# Patient Record
Sex: Female | Born: 1955 | Race: Black or African American | Hispanic: No | Marital: Married | Smoking: Never smoker
Health system: Southern US, Community
[De-identification: ages and names within clinical notes are randomized; demographics above are authoritative.]

## PROBLEM LIST (undated history)

## (undated) DIAGNOSIS — I10 Essential (primary) hypertension: Secondary | ICD-10-CM

## (undated) DIAGNOSIS — E7800 Pure hypercholesterolemia, unspecified: Secondary | ICD-10-CM

---

## 2011-09-09 ENCOUNTER — Emergency Department (INDEPENDENT_AMBULATORY_CARE_PROVIDER_SITE_OTHER): Payer: BC Managed Care – PPO

## 2011-09-09 ENCOUNTER — Encounter (HOSPITAL_COMMUNITY): Payer: Self-pay

## 2011-09-09 ENCOUNTER — Emergency Department (INDEPENDENT_AMBULATORY_CARE_PROVIDER_SITE_OTHER)
Admission: EM | Admit: 2011-09-09 | Discharge: 2011-09-09 | Disposition: A | Discharge status: Home / Self Care | Payer: BC Managed Care – PPO | Source: Home / Self Care | Attending: Family Medicine | Admitting: Family Medicine

## 2011-09-09 DIAGNOSIS — J209 Acute bronchitis, unspecified: Secondary | ICD-10-CM

## 2011-09-09 HISTORY — DX: Essential (primary) hypertension: I10

## 2011-09-09 HISTORY — DX: Pure hypercholesterolemia, unspecified: E78.00

## 2011-09-09 MED ORDER — AZITHROMYCIN 250 MG PO TABS: 250.0000 mg | ORAL_TABLET | Freq: Every day | ORAL | Status: AC

## 2011-09-09 MED ORDER — GUAIFENESIN-CODEINE 100-10 MG/5ML PO SYRP: ORAL_SOLUTION | ORAL | Status: DC

## 2011-09-09 NOTE — ED Notes (Signed)
Cold for a few days, getting worse productive since Monday ; decreased breath sounds RUL; barking , productive in nature w reported yellow secretions

## 2011-09-09 NOTE — ED Provider Notes (Signed)
History     CSN: [id number]  Arrival date & time 09/09/11  1312   First MD Initiated Contact with Patient 09/09/11 1517      Chief Complaint  Patient presents with  . Cough    (Consider location/radiation/quality/duration/timing/severity/associated sxs/prior treatment) HPI Comments: The patient reports having a cough for the past 3 days. Started in her head but that seems to be clearing. Cough is dry and on occasion productive. Worse at night. Keeping her awake. Taking otc cough prep. No fever. States she does not feel bad at all.   The history is provided by the patient.    Past Medical History  Diagnosis Date  . Diabetes mellitus   . Hypertension   . Elevated cholesterol     History reviewed. No pertinent past surgical history.  History reviewed. No pertinent family history.  History  Substance Use Topics  . Smoking status: Never Smoker   . Smokeless tobacco: Not on file  . Alcohol Use: No    OB History    Grav Para Term Preterm Abortions TAB SAB Ect Mult Living                  Review of Systems  Constitutional: Negative.   HENT: Positive for congestion and rhinorrhea. Negative for sore throat and trouble swallowing.   Eyes: Negative.   Respiratory: Positive for cough. Negative for shortness of breath and wheezing.   Cardiovascular: Negative.   Gastrointestinal: Negative.   Genitourinary: Negative.   Musculoskeletal: Negative.   Neurological: Negative for headaches.    Allergies  Review of patient's allergies indicates no known allergies.  Home Medications   Current Outpatient Rx  Name Route Sig Dispense Refill  . ASPIRIN 81 MG PO TABS Oral Take 81 mg by mouth daily.    Marland Kitchen VITAMIN D 1000 UNITS PO TABS Oral Take 1,000 Units by mouth daily.    Marland Kitchen LOSARTAN POTASSIUM-HCTZ 100-12.5 MG PO TABS Oral Take 1 tablet by mouth daily.    Marland Kitchen METFORMIN HCL 500 MG PO TABS Oral Take 500 mg by mouth 2 (two) times daily with a meal.    . AZITHROMYCIN 250 MG PO TABS  Oral Take 1 tablet (250 mg total) by mouth daily. Take first 2 tablets together, then 1 every day until finished. 6 tablet 0  . GUAIFENESIN-CODEINE 100-10 MG/5ML PO SYRP  1-2 tsp po q 6 hrs prn cough 120 mL 0    BP 140/89  Pulse 78  Temp(Src) 97.6 F (36.4 C) (Oral)  Resp 18  SpO2 96%  Physical Exam  Nursing note and vitals reviewed. Constitutional: She appears well-developed and well-nourished. No distress.  HENT:  Head: Normocephalic and atraumatic.  Mouth/Throat: Oropharynx is clear and moist.  Neck: Normal range of motion. Neck supple.  Cardiovascular: Normal rate, regular rhythm and normal heart sounds.   Pulmonary/Chest:       Constant cough. No wheezing.   Abdominal: Soft. Bowel sounds are normal. There is tenderness.  Lymphadenopathy:    She has no cervical adenopathy.  Skin: Skin is warm and dry.    ED Course  Procedures (including critical care time)  Labs Reviewed - No data to display Dg Chest 2 View  09/09/2011  *RADIOLOGY REPORT*  Clinical Data: Cough and chest congestion.  CHEST - 2 VIEW  Comparison:  None.  Findings:  The heart size and mediastinal contours are within normal limits.  Both lungs are clear.  The visualized skeletal structures are unremarkable.  IMPRESSION: No active  cardiopulmonary disease.  Original Report Authenticated By: Danae Orleans, M.D.   Xray appears neg for pneumonia. Radiology report not back yet  1. Bronchitis, acute       MDM         Randa Spike, MD 09/09/11 [phone]

## 2011-09-09 NOTE — Discharge Instructions (Signed)
Tylenol or motrin as needed. Avoid caffeine and milk products. Follow up with your pcp or return if symptoms persist or worsenAcute Bronchitis You have acute bronchitis. This means you have a chest cold. The airways in your lungs are red and sore (inflamed). Acute means it is sudden onset.  CAUSES Bronchitis is most often caused by the same virus that causes a cold. SYMPTOMS   Body aches.   Chest congestion.   Chills.   Cough.   Fever.   Shortness of breath.   Sore throat.  TREATMENT  Acute bronchitis is usually treated with rest, fluids, and medicines for relief of fever or cough. Most symptoms should go away after a few days or a week. Increased fluids may help thin your secretions and will prevent dehydration. Your caregiver may give you an inhaler to improve your symptoms. The inhaler reduces shortness of breath and helps control cough. You can take over-the-counter pain relievers or cough medicine to decrease coughing, pain, or fever. A cool-air vaporizer may help thin bronchial secretions and make it easier to clear your chest. Antibiotics are usually not needed but can be prescribed if you smoke, are seriously ill, have chronic lung problems, are elderly, or you are at higher risk for developing complications.Allergies and asthma can make bronchitis worse. Repeated episodes of bronchitis may cause longstanding lung problems. Avoid smoking and secondhand smoke.Exposure to cigarette smoke or irritating chemicals will make bronchitis worse. If you are a cigarette smoker, consider using nicotine gum or skin patches to help control withdrawal symptoms. Quitting smoking will help your lungs heal faster. Recovery from bronchitis is often slow, but you should start feeling better after 2 to 3 days. Cough from bronchitis frequently lasts for 3 to 4 weeks. To prevent another bout of acute bronchitis:  Quit smoking.   Wash your hands frequently to get rid of viruses or use a hand sanitizer.    Avoid other people with cold or virus symptoms.   Try not to touch your hands to your mouth, nose, or eyes.  SEEK IMMEDIATE MEDICAL CARE IF:  You develop increased fever, chills, or chest pain.   You have severe shortness of breath or bloody sputum.   You develop dehydration, fainting, repeated vomiting, or a severe headache.   You have no improvement after 1 week of treatment or you get worse.  MAKE SURE YOU:   Understand these instructions.   Will watch your condition.   Will get help right away if you are not doing well or get worse.  Document Released: 07/07/2004 Document Revised: 05/19/2011 Document Reviewed: 09/22/2010 [facility] Patient Information 2012 [place], [place].Bronchitis Bronchitis is a problem of the air tubes leading to your lungs. This problem makes it hard for air to get in and out of the lungs. You may cough a lot because your air tubes are narrow. Going without care can cause lasting (chronic) bronchitis. HOME CARE   Drink enough fluids to keep your pee (urine) clear or pale yellow.   Use a cool mist humidifier.   Quit smoking if you smoke. If you keep smoking, the bronchitis might not get better.   Only take medicine as told by your doctor.  GET HELP RIGHT AWAY IF:   Coughing keeps you awake.   You start to wheeze.   You become more sick or weak.   You have a hard time breathing or get short of breath.   You cough up blood.   Coughing lasts more than 2 weeks.  You have a fever.   Your baby is older than 3 months with a rectal temperature of 102 F (38.9 C) or higher.   Your baby is 73 months old or younger with a rectal temperature of 100.4 F (38 C) or higher.  MAKE SURE YOU:  Understand these instructions.   Will watch your condition.   Will get help right away if you are not doing well or get worse.  Document Released: 11/16/2007 Document Revised: 05/19/2011 Document Reviewed: 05/01/2009 [facility] Patient Information 2012  [place], [place].

## 2012-05-28 DIAGNOSIS — E785 Hyperlipidemia, unspecified: Secondary | ICD-10-CM | POA: Insufficient documentation

## 2012-05-28 DIAGNOSIS — I10 Essential (primary) hypertension: Secondary | ICD-10-CM | POA: Insufficient documentation

## 2012-10-13 ENCOUNTER — Ambulatory Visit (INDEPENDENT_AMBULATORY_CARE_PROVIDER_SITE_OTHER): Payer: 59 | Admitting: Family Medicine

## 2012-10-13 VITALS — BP 123/84 | HR 87 | Temp 99.2°F | Resp 18 | Ht 66.0 in | Wt 201.4 lb

## 2012-10-13 DIAGNOSIS — J209 Acute bronchitis, unspecified: Secondary | ICD-10-CM

## 2012-10-13 MED ORDER — AZITHROMYCIN 250 MG PO TABS: ORAL_TABLET | ORAL | Status: DC

## 2012-10-13 MED ORDER — HYDROCODONE-HOMATROPINE 5-1.5 MG/5ML PO SYRP: 5.0000 mL | ORAL_SOLUTION | Freq: Three times a day (TID) | ORAL | Status: DC | PRN

## 2012-10-13 NOTE — Patient Instructions (Signed)

## 2012-10-13 NOTE — Progress Notes (Signed)
Patient ID: Tina Harper MRN: [id number], DOB: [birth year], 57 y.o. Date of Encounter: 10/13/2012, 3:30 PM  Primary Physician: Teena Irani, PA-C  Chief Complaint:  Chief Complaint  Patient presents with  . Cough    Wednesday  . Headache  . Back Pain    hip pain  . Fatigue    HPI: 57 y.o. year old female presents with a 3 day history of nasal congestion, post nasal drip, sore throat, and cough. Mild sinus pressure. Afebrile. No chills. Nasal congestion thick and green/yellow. Cough is productive of green/yellow sputum and not associated with time of day. Ears feel full, leading to sensation of muffled hearing. Has tried OTC cold preps without success. No GI complaints.   No sick contacts, recent antibiotics, or recent travels.   No leg trauma, sedentary periods, h/o cancer, or tobacco use.  Past Medical History  Diagnosis Date  . Diabetes mellitus   . Hypertension   . Elevated cholesterol      Home Meds: Prior to Admission medications   Medication Sig Start Date End Date Taking? Authorizing Provider  aspirin 81 MG tablet Take 81 mg by mouth daily.   Yes Historical Provider, MD  atorvastatin (LIPITOR) 10 MG tablet Take 10 mg by mouth daily.   Yes Historical Provider, MD  cholecalciferol (VITAMIN D) 1000 UNITS tablet Take 1,000 Units by mouth daily.   Yes Historical Provider, MD  losartan-hydrochlorothiazide (HYZAAR) 100-12.5 MG per tablet Take 1 tablet by mouth daily.   Yes Historical Provider, MD  metFORMIN (GLUCOPHAGE) 500 MG tablet Take 500 mg by mouth 2 (two) times daily with a meal.   Yes Historical Provider, MD  azithromycin (ZITHROMAX Z-PAK) 250 MG tablet Take as directed on pack 10/13/12   Elvina Sidle, MD  guaiFENesin-codeine [facility]) 100-10 MG/5ML syrup 1-2 tsp po q 6 hrs prn cough 09/09/11   Claretha Cooper Lykins, DO  HYDROcodone-homatropine (HYCODAN) 5-1.5 MG/5ML syrup Take 5 mLs by mouth every 8 (eight) hours as needed for cough. 10/13/12   Elvina Sidle, MD      Allergies: No Known Allergies  History   Social History  . Marital Status: Married    Spouse Name: N/A    Number of Children: N/A  . Years of Education: N/A   Occupational History  . Not on file.   Social History Main Topics  . Smoking status: Never Smoker   . Smokeless tobacco: Not on file  . Alcohol Use: No  . Drug Use: No  . Sexually Active: Not on file   Other Topics Concern  . Not on file   Social History Narrative  . No narrative on file     Review of Systems: Constitutional: negative for chills, fever, night sweats or weight changes Cardiovascular: negative for chest pain or palpitations Respiratory: negative for hemoptysis, wheezing, or shortness of breath Abdominal: negative for abdominal pain, nausea, vomiting or diarrhea Dermatological: negative for rash Neurologic: negative for headache   Physical Exam: Blood pressure 123/84, pulse 87, temperature 99.2 F (37.3 C), temperature source Oral, resp. rate 18, height 5\' 6"  (1.676 m), weight 201 lb 6.4 oz (91.354 kg), SpO2 98.00%., Body mass index is 32.52 kg/(m^2). General: Well developed, well nourished, in no acute distress. Head: Normocephalic, atraumatic, eyes without discharge, sclera non-icteric, nares are congested. Bilateral auditory canals clear, TM's are without perforation, pearly grey with reflective cone of light bilaterally. No sinus TTP. Oral cavity moist, dentition normal. Posterior pharynx with post nasal drip and mild erythema. No  peritonsillar abscess or tonsillar exudate. Neck: Supple. No thyromegaly. Full ROM. No lymphadenopathy. Lungs: Coarse breath sounds bilaterally without wheezes, rales, or rhonchi. Breathing is unlabored.  Heart: RRR with S1 S2. No murmurs, rubs, or gallops appreciated. Msk:  Strength and tone normal for age. Extremities: No clubbing or cyanosis. No edema. Neuro: Alert and oriented X 3. Moves all extremities spontaneously. CNII-XII grossly in tact. Psych:  Responds  to questions appropriately with a normal affect.     ASSESSMENT AND PLAN:  57 y.o. year old female with bronchitis. Acute bronchitis - Plan: azithromycin (ZITHROMAX Z-PAK) 250 MG tablet, HYDROcodone-homatropine (HYCODAN) 5-1.5 MG/5ML syrup   - -Mucinex -Tylenol/Motrin prn -Rest/fluids -RTC precautions -RTC 3-5 days if no improvement  Signed, Elvina Sidle, MD 10/13/2012 3:30 PM

## 2013-01-03 IMAGING — CR DG CHEST 2V
2 series · 2 of 2 positions shown · non-contrast
Comparison: None.

CLINICAL DATA: Cough and chest congestion.

CHEST - 2 VIEW

[view not recorded (1 of 2)]
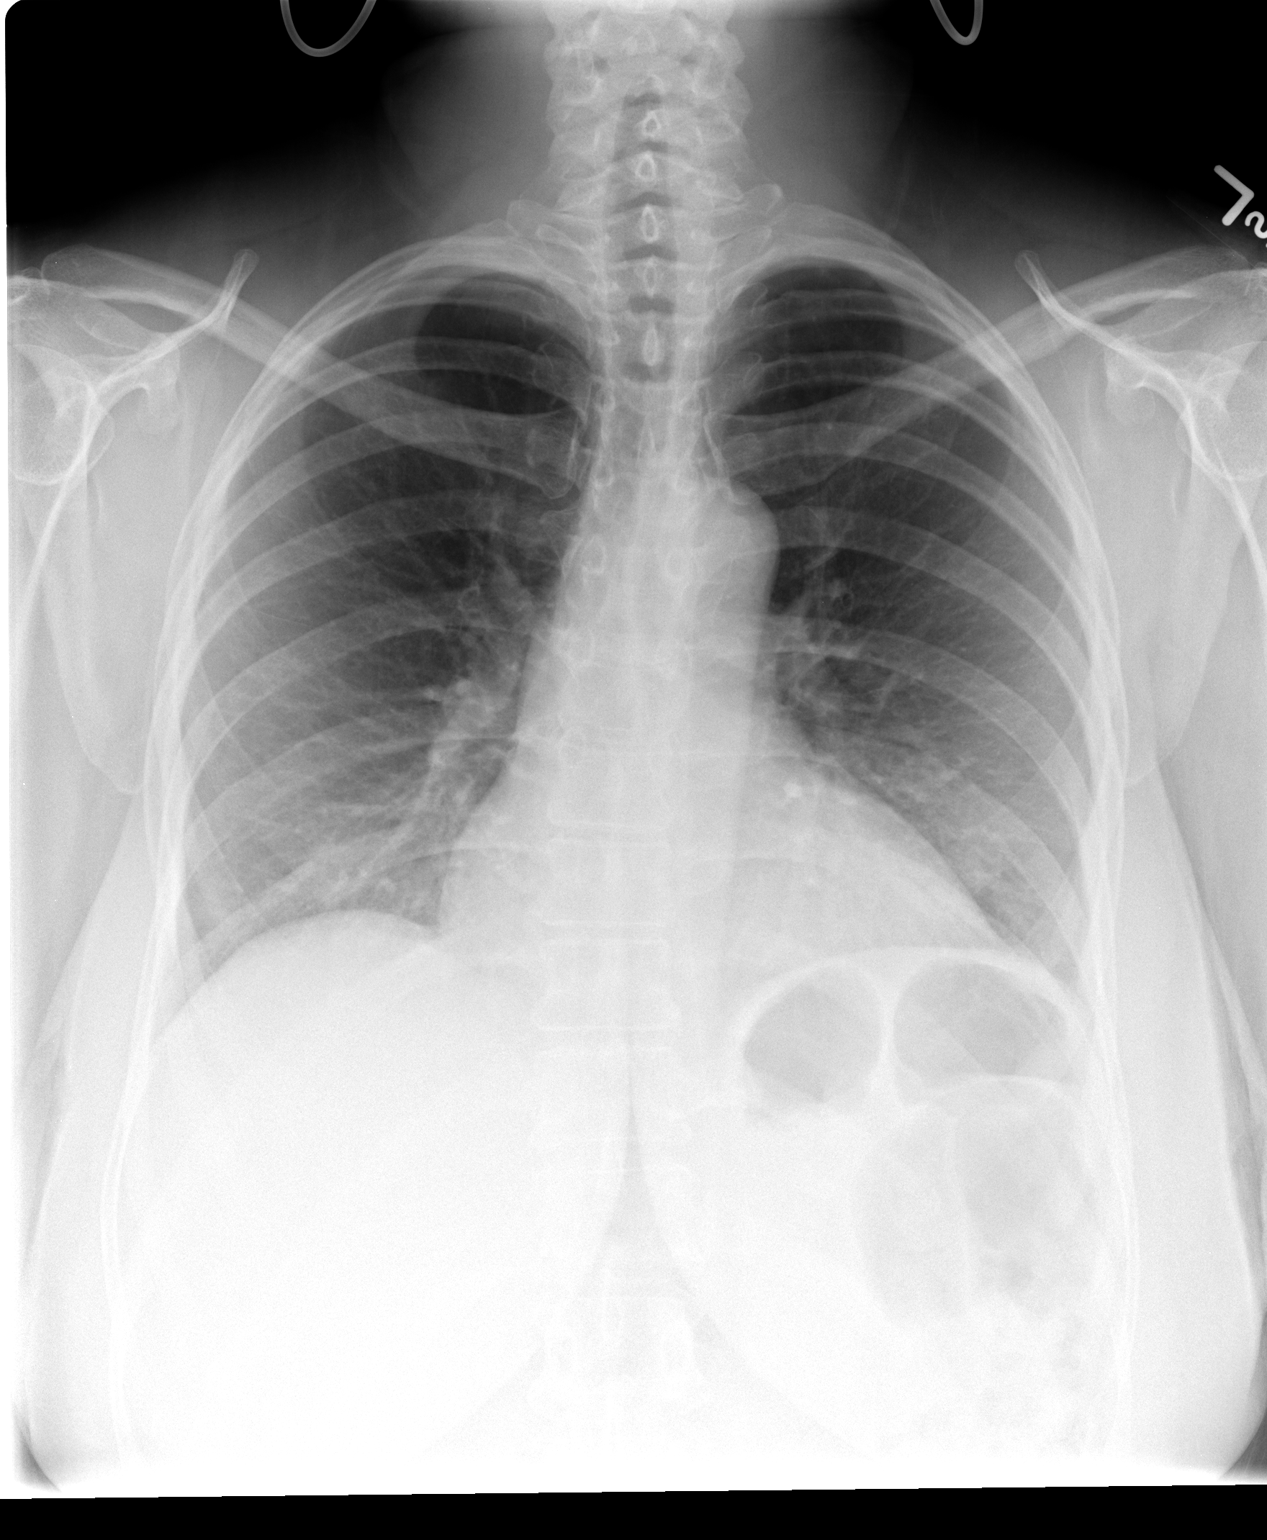

[view not recorded (2 of 2)]
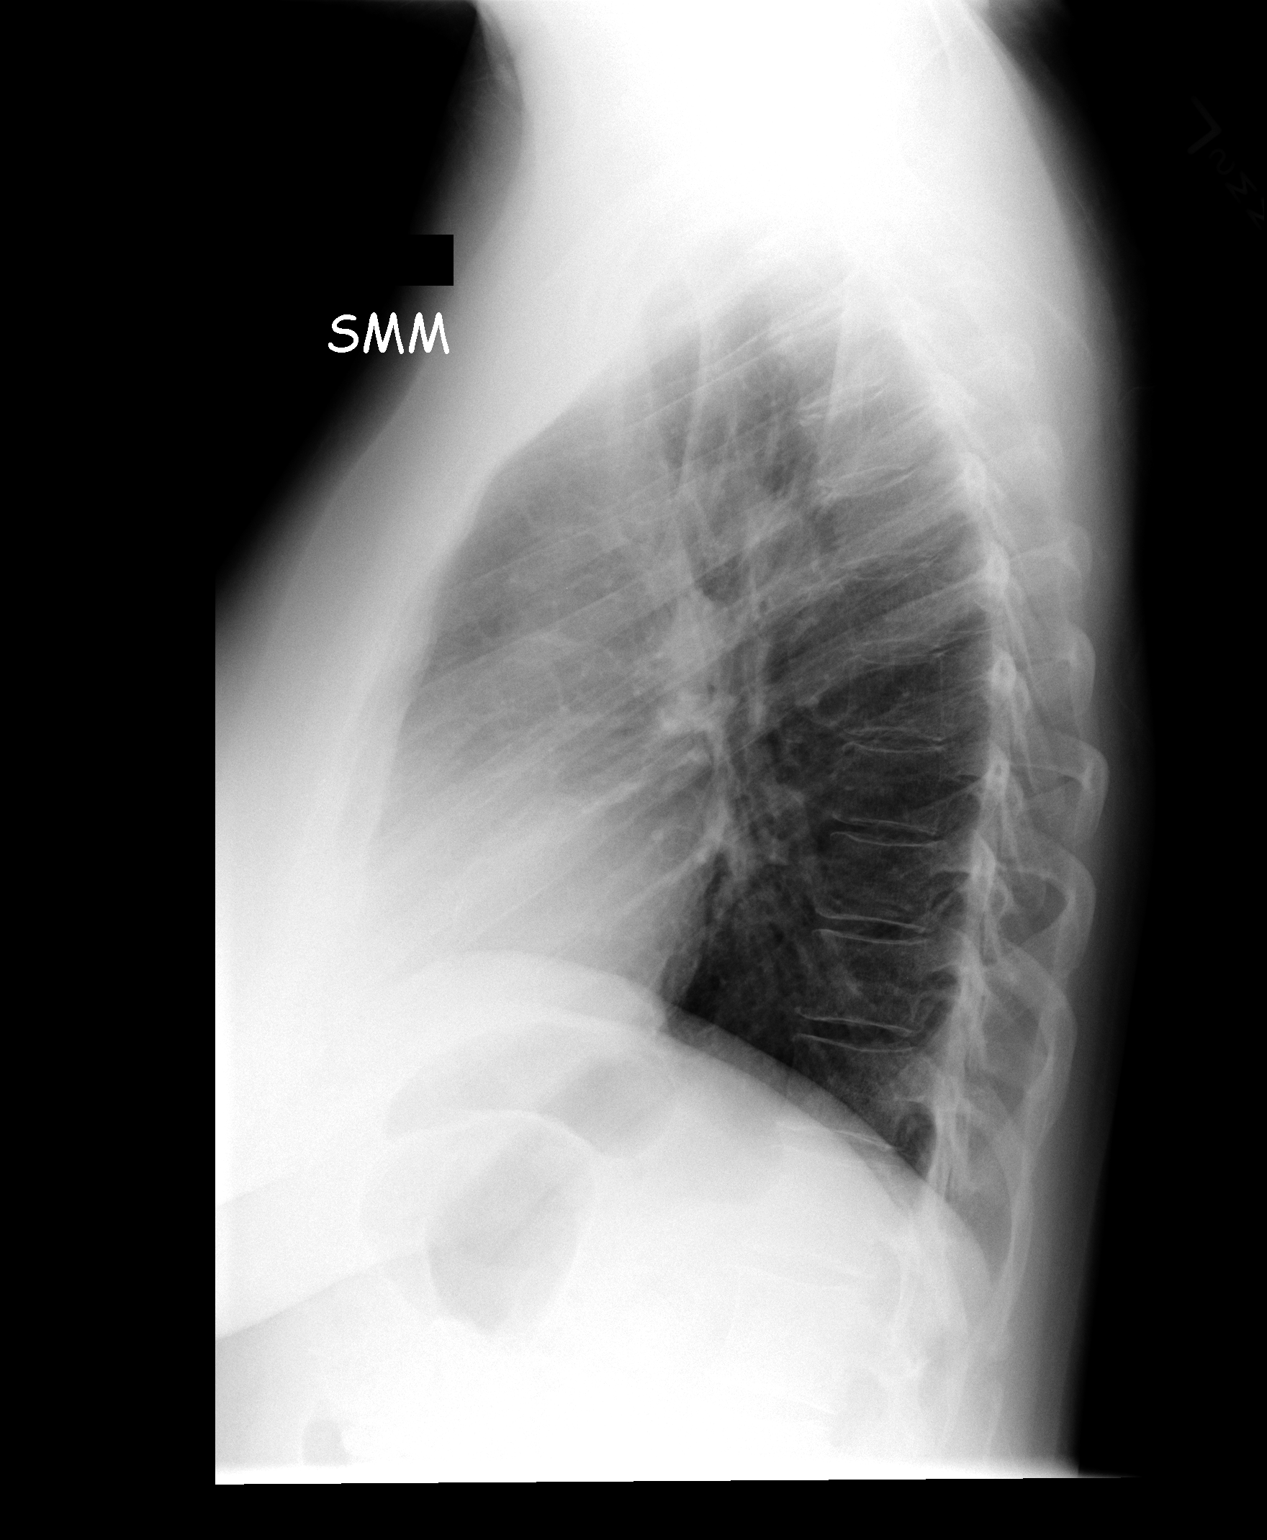

[2 of 2 positions shown; findings below may reference images not displayed]

FINDINGS: The heart size and mediastinal contours are within
normal limits.  Both lungs are clear.  The visualized skeletal
structures are unremarkable.
IMPRESSION: No active cardiopulmonary disease.

## 2013-03-18 DIAGNOSIS — D72829 Elevated white blood cell count, unspecified: Secondary | ICD-10-CM | POA: Insufficient documentation

## 2013-12-21 ENCOUNTER — Ambulatory Visit (INDEPENDENT_AMBULATORY_CARE_PROVIDER_SITE_OTHER): Payer: 59 | Admitting: Family Medicine

## 2013-12-21 VITALS — BP 120/82 | HR 91 | Temp 98.4°F | Resp 16 | Ht 66.0 in | Wt 201.5 lb

## 2013-12-21 DIAGNOSIS — I10 Essential (primary) hypertension: Secondary | ICD-10-CM

## 2013-12-21 DIAGNOSIS — E119 Type 2 diabetes mellitus without complications: Secondary | ICD-10-CM

## 2013-12-21 DIAGNOSIS — J029 Acute pharyngitis, unspecified: Secondary | ICD-10-CM

## 2013-12-21 DIAGNOSIS — R42 Dizziness and giddiness: Secondary | ICD-10-CM

## 2013-12-21 DIAGNOSIS — R110 Nausea: Secondary | ICD-10-CM

## 2013-12-21 DIAGNOSIS — R51 Headache: Secondary | ICD-10-CM

## 2013-12-21 LAB — POCT URINALYSIS DIPSTICK
BILIRUBIN UA: NEGATIVE
GLUCOSE UA: NEGATIVE
Ketones, UA: NEGATIVE
Nitrite, UA: NEGATIVE
PH UA: 6.5
Protein, UA: NEGATIVE
RBC UA: NEGATIVE
SPEC GRAV UA: 1.01
Urobilinogen, UA: 0.2

## 2013-12-21 LAB — POCT UA - MICROSCOPIC ONLY
CRYSTALS, UR, HPF, POC: NEGATIVE
Casts, Ur, LPF, POC: NEGATIVE
MUCUS UA: NEGATIVE
RBC, URINE, MICROSCOPIC: NEGATIVE
Yeast, UA: NEGATIVE

## 2013-12-21 LAB — GLUCOSE, POCT (MANUAL RESULT ENTRY): POC Glucose: 93 mg/dl (ref 70–99)

## 2013-12-21 NOTE — Progress Notes (Signed)
Subjective:    Patient ID: Tina Harper, female    DOB: 1956-03-02, 58 y.o.   MRN: [id number]  12/21/2013  Headache, Nausea and Dizziness   HPI This 58 y.o. female presents for evaluation of headache, dizziness, nausea.  Onset of symptoms 1.5 weeks ago.  No fever/chills/sweats.  +HA frontal sinus region; feels like sinus pressure; moderate headache.  No sore throat, ear pain, rhinorrhea, or nasal congestion; no cough. No chest pain, palpitations, SOB, leg swelling.   +heart rate has been elevated in 90s-105s which is unusual for patient.  +malaise and fatigue.  +dizziness mild and intermittent.  Dizziness occurs with changing positions. No spinning of the room.  No tinnitus or hearing loss.  No tick bites. Has noticed an odor to breath.  No vomiting or diarrhea.  No sick contacts.  No rash.  No tick bites.  Vision in L eye seems weaker. No diplopia.  No unsteadiness.  No recent stressors; no personal stressors or work stressors. No change in medications or cessation of medications recently.  No urinary symptoms; denies dysuria, urgency, frequency, hematuria. No abdominal pain.  Has not checked sugar lately.   Review of Systems  Constitutional: Positive for fatigue. Negative for fever, chills and diaphoresis.  HENT: Positive for sinus pressure. Negative for congestion, dental problem, drooling, ear pain, postnasal drip, rhinorrhea, sneezing, sore throat and trouble swallowing.   Respiratory: Negative for cough and shortness of breath.   Cardiovascular: Negative for chest pain, palpitations and leg swelling.  Gastrointestinal: Negative for nausea, vomiting, abdominal pain, diarrhea and constipation.  Genitourinary: Negative for dysuria, urgency, frequency, hematuria, flank pain and genital sores.  Skin: Negative for rash.  Neurological: Positive for dizziness, light-headedness and headaches. Negative for tremors, seizures, syncope, facial asymmetry, speech difficulty, weakness and numbness.    Hematological: Negative for adenopathy. Does not bruise/bleed easily.    Past Medical History  Diagnosis Date  . Diabetes mellitus   . Hypertension   . Elevated cholesterol    History reviewed. No pertinent past surgical history. No Known Allergies Current Outpatient Prescriptions  Medication Sig Dispense Refill  . aspirin 81 MG tablet Take 81 mg by mouth daily.      Marland Kitchen atorvastatin (LIPITOR) 10 MG tablet Take 10 mg by mouth daily.      . cholecalciferol (VITAMIN D) 1000 UNITS tablet Take 1,000 Units by mouth daily.      Marland Kitchen losartan-hydrochlorothiazide (HYZAAR) 100-12.5 MG per tablet Take 1 tablet by mouth daily.      . metFORMIN (GLUCOPHAGE) 500 MG tablet Take 500 mg by mouth 2 (two) times daily with a meal.       No current facility-administered medications for this visit.   History   Social History  . Marital Status: Married    Spouse Name: N/A    Number of Children: N/A  . Years of Education: N/A   Occupational History  . Not on file.   Social History Main Topics  . Smoking status: Never Smoker   . Smokeless tobacco: Never Used  . Alcohol Use: No  . Drug Use: No  . Sexual Activity: Not on file   Other Topics Concern  . Not on file   Social History Narrative  . No narrative on file        Objective:    BP 120/82  Pulse 91  Temp(Src) 98.4 F (36.9 C) (Oral)  Resp 16  Ht 5\' 6"  (1.676 m)  Wt 201 lb 8 oz (91.4 kg)  BMI  32.54 kg/m2  SpO2 98% Physical Exam  Constitutional: She is oriented to person, place, and time. She appears well-developed and well-nourished. No distress.  HENT:  Head: Normocephalic and atraumatic.  Right Ear: External ear normal.  Left Ear: External ear normal.  Nose: Nose normal.  Mouth/Throat: Oropharynx is clear and moist.  Eyes: Conjunctivae and EOM are normal. Pupils are equal, round, and reactive to light.  Neck: Normal range of motion. Neck supple. Carotid bruit is not present. No thyromegaly present.  Cardiovascular:  Normal rate, regular rhythm, normal heart sounds and intact distal pulses.  Exam reveals no gallop and no friction rub.   No murmur heard. Pulmonary/Chest: Effort normal and breath sounds normal. She has no wheezes. She has no rales.  Abdominal: Soft. Bowel sounds are normal. She exhibits no distension and no mass. There is no tenderness. There is no rebound and no guarding.  Lymphadenopathy:    She has no cervical adenopathy.  Neurological: She is alert and oriented to person, place, and time. No cranial nerve deficit. She exhibits normal muscle tone. She displays a negative Romberg sign. Coordination normal.  Skin: Skin is warm and dry. No rash noted. She is not diaphoretic. No erythema. No pallor.  Psychiatric: She has a normal mood and affect. Her behavior is normal.   Results for orders placed in visit on 12/21/13  POCT URINALYSIS DIPSTICK      Result Value Ref Range   Color, UA yellow     Clarity, UA clear     Glucose, UA neg     Bilirubin, UA neg     Ketones, UA neg     Spec Grav, UA 1.010     Blood, UA neg     pH, UA 6.5     Protein, UA neg     Urobilinogen, UA 0.2     Nitrite, UA neg     Leukocytes, UA small (1+)    POCT UA - MICROSCOPIC ONLY      Result Value Ref Range   WBC, Ur, HPF, POC 0-3     RBC, urine, microscopic neg     Bacteria, U Microscopic trace     Mucus, UA neg     Epithelial cells, urine per micros 1-4     Crystals, Ur, HPF, POC neg     Casts, Ur, LPF, POC neg     Yeast, UA neg    GLUCOSE, POCT (MANUAL RESULT ENTRY)      Result Value Ref Range   POC Glucose 93  70 - 99 mg/dl   EKG: NSR: rate 74; ST changes in inferior leads.    Assessment & Plan:   1. Essential hypertension, benign   2. Dizziness and giddiness   3. Headache(784.0)   4. Nausea alone   5. Type II or unspecified type diabetes mellitus without mention of complication, not stated as uncontrolled   6. Sore throat    1.  Dizziness/light-headedness:  New.  Normal neurological exam.  Obtain labs. 2. HA: New.  Benign exam in office; normal neurological exam.  Supportive care with hydration, Tylenol or Motrin. RTC for acute worsening. 3.  Nausea:  New.  Associated with headache, dizziness.  Benign abdominal exam in office; obtain labs including Urine culture.  Recommend BRAT diet, hydration. Pt declined anti-nausea medication. 4. HTN: controlled; no change in therapy. 5. DMII: stable yet does not check sugars at home.  Obtain labs.  No orders of the defined types were placed in this encounter.  No Follow-up on file.  Nilda Simmer, M.D.  [facility] [address] [place], [place]  [address] [phone] phone [phone] fax

## 2013-12-22 LAB — CBC WITH DIFFERENTIAL/PLATELET
Basophils Absolute: 0 10*3/uL (ref 0.0–0.1)
Basophils Relative: 0 % (ref 0–1)
EOS PCT: 1 % (ref 0–5)
Eosinophils Absolute: 0.1 10*3/uL (ref 0.0–0.7)
HEMATOCRIT: 37 % (ref 36.0–46.0)
HEMOGLOBIN: 12.9 g/dL (ref 12.0–15.0)
LYMPHS ABS: 3.9 10*3/uL (ref 0.7–4.0)
LYMPHS PCT: 54 % — AB (ref 12–46)
MCH: 29.6 pg (ref 26.0–34.0)
MCHC: 34.9 g/dL (ref 30.0–36.0)
MCV: 84.9 fL (ref 78.0–100.0)
MONOS PCT: 6 % (ref 3–12)
Monocytes Absolute: 0.4 10*3/uL (ref 0.1–1.0)
Neutro Abs: 2.8 10*3/uL (ref 1.7–7.7)
Neutrophils Relative %: 39 % — ABNORMAL LOW (ref 43–77)
PLATELETS: 439 10*3/uL — AB (ref 150–400)
RBC: 4.36 MIL/uL (ref 3.87–5.11)
RDW: 14 % (ref 11.5–15.5)
WBC: 7.2 10*3/uL (ref 4.0–10.5)

## 2013-12-22 LAB — COMPREHENSIVE METABOLIC PANEL
ALT: 17 U/L (ref 0–35)
AST: 17 U/L (ref 0–37)
Albumin: 4.7 g/dL (ref 3.5–5.2)
Alkaline Phosphatase: 60 U/L (ref 39–117)
BILIRUBIN TOTAL: 0.4 mg/dL (ref 0.2–1.2)
BUN: 11 mg/dL (ref 6–23)
CO2: 29 meq/L (ref 19–32)
CREATININE: 0.84 mg/dL (ref 0.50–1.10)
Calcium: 10 mg/dL (ref 8.4–10.5)
Chloride: 98 mEq/L (ref 96–112)
GLUCOSE: 102 mg/dL — AB (ref 70–99)
Potassium: 4 mEq/L (ref 3.5–5.3)
Sodium: 138 mEq/L (ref 135–145)
TOTAL PROTEIN: 7.6 g/dL (ref 6.0–8.3)

## 2013-12-23 LAB — URINE CULTURE

## 2013-12-23 LAB — ROCKY MTN SPOTTED FVR AB, IGM-BLOOD: ROCKY MTN SPOTTED FEVER, IGM: 0.07 IV

## 2013-12-24 LAB — CULTURE, GROUP A STREP: Organism ID, Bacteria: NORMAL

## 2015-02-24 ENCOUNTER — Encounter: Payer: Self-pay | Admitting: Lab

## 2015-02-24 NOTE — Progress Notes (Signed)
Pt is scheduled to have labs drawn on 02/25/15.  She will receive an appmt at that time with Dr. Luciana Axe.

## 2015-03-24 DIAGNOSIS — E559 Vitamin D deficiency, unspecified: Secondary | ICD-10-CM | POA: Insufficient documentation

## 2015-11-30 ENCOUNTER — Ambulatory Visit (INDEPENDENT_AMBULATORY_CARE_PROVIDER_SITE_OTHER): Payer: 59 | Admitting: Family Medicine

## 2015-11-30 VITALS — BP 114/72 | HR 75 | Temp 98.4°F | Resp 16 | Ht 65.5 in | Wt 202.2 lb

## 2015-11-30 DIAGNOSIS — H109 Unspecified conjunctivitis: Secondary | ICD-10-CM

## 2015-11-30 MED ORDER — AZELASTINE HCL 0.05 % OP SOLN
1.0000 [drp] | Freq: Two times a day (BID) | OPHTHALMIC | Status: AC
Start: 2015-11-30 — End: ?

## 2015-11-30 MED ORDER — OFLOXACIN 0.3 % OP SOLN: 1.0000 [drp] | OPHTHALMIC | Status: AC

## 2015-11-30 NOTE — Patient Instructions (Addendum)
IF you received an x-ray today, you will receive an invoice from [facility]. Please contact [facility] at [phone] with questions or concerns regarding your invoice.   IF you received labwork today, you will receive an invoice from [facility]. Please contact [facility] at [phone] with questions or concerns regarding your invoice.   Our billing staff will not be able to assist you with questions regarding bills from these companies.  You will be contacted with the lab results as soon as they are available. The fastest way to get your results is to activate your My Chart account. Instructions are located on the last page of this paperwork. If you have not heard from us regarding the results in 2 weeks, please contact this office.   Allergic Conjunctivitis Allergic conjunctivitis is inflammation of the clear membrane that covers the white part of your eye and the inner surface of your eyelid (conjunctiva), and it is caused by allergies. The blood vessels in the conjunctiva become inflamed, and this causes the eye to become red or pink, and it often causes itchiness in the eye. Allergic conjunctivitis cannot be spread by one person to another person (noncontagious). CAUSES This condition is caused by an allergic reaction. Common causes of an allergic reaction (allergens) include:  Dust.  Pollen.  Mold.  Animal dander or secretions. RISK FACTORS This condition is more likely to develop if you are exposed to high levels of allergens that cause the allergic reaction. This might include being outdoors when air pollen levels are high or being around animals that you are allergic to. SYMPTOMS Symptoms of this condition may include:  Eye redness.  Tearing of the eyes.  Watery eyes.  Itchy eyes.  Burning feeling in the eyes.  Clear drainage from the eyes.  Swollen eyelids. DIAGNOSIS This condition may be diagnosed by medical  history and physical exam. If you have drainage from your eyes, it may be tested to rule out other causes of conjunctivitis. TREATMENT Treatment for this condition often includes medicines. These may be eye drops, ointments, or oral medicines. They may be prescription medicines or over-the-counter medicines. HOME CARE INSTRUCTIONS  Take or apply medicines only as directed by your health care provider.  Do not touch or rub your eyes.  Do not wear contact lenses until the inflammation is gone. Wear glasses instead.  Do not wear eye makeup until the inflammation is gone.  Apply a cool, clean washcloth to your eye for 10-20 minutes, 3-4 times a day.  Try to avoid whatever allergen is causing the allergic reaction. SEEK MEDICAL CARE IF:  Your symptoms get worse.  You have pus draining from your eye.  You have new symptoms.  You have a fever.   This information is not intended to replace advice given to you by your health care provider. Make sure you discuss any questions you have with your health care provider.   Document Released: 08/20/2002 Document Revised: 06/20/2014 Document Reviewed: 03/11/2014 Elsevier Interactive Patient Education 2016 Else[employer] Inc[employer]  Bacterial Conjunctivitis Bacterial conjunctivitis, commonly called pink eye, is an inflammation of the clear membrane that covers the white part of the eye (conjunctiva). The inflammation can also happen on the underside of the eyelids. The blood vessels in the conjunctiva become inflamed, causing the eye to become red or pink. Bacterial conjunctivitis may spread easily from one eye to another and from person to person (contagious).  CAUSES  Bacterial conjunctivitis is caused by bacteria. The bacteria  may come from your own skin, your upper respiratory tract, or from someone else with bacterial conjunctivitis. SYMPTOMS  The normally white color of the eye or the underside of the eyelid is usually pink or red. The pink eye is  usually associated with irritation, tearing, and some sensitivity to light. Bacterial conjunctivitis is often associated with a thick, yellowish discharge from the eye. The discharge may turn into a crust on the eyelids overnight, which causes your eyelids to stick together. If a discharge is present, there may also be some blurred vision in the affected eye. DIAGNOSIS  Bacterial conjunctivitis is diagnosed by your caregiver through an eye exam and the symptoms that you report. Your caregiver looks for changes in the surface tissues of your eyes, which may point to the specific type of conjunctivitis. A sample of any discharge may be collected on a cotton-tip swab if you have a severe case of conjunctivitis, if your cornea is affected, or if you keep getting repeat infections that do not respond to treatment. The sample will be sent to a lab to see if the inflammation is caused by a bacterial infection and to see if the infection will respond to antibiotic medicines. TREATMENT   Bacterial conjunctivitis is treated with antibiotics. Antibiotic eyedrops are most often used. However, antibiotic ointments are also available. Antibiotics pills are sometimes used. Artificial tears or eye washes may ease discomfort. HOME CARE INSTRUCTIONS   To ease discomfort, apply a cool, clean washcloth to your eye for 10-20 minutes, 3-4 times a day.  Gently wipe away any drainage from your eye with a warm, wet washcloth or a cotton ball.  Wash your hands often with soap and water. Use paper towels to dry your hands.  Do not share towels or washcloths. This may spread the infection.  Change or wash your pillowcase every day.  You should not use eye makeup until the infection is gone.  Do not operate machinery or drive if your vision is blurred.  Stop using contact lenses. Ask your caregiver how to sterilize or replace your contacts before using them again. This depends on the type of contact lenses that you  use.  When applying medicine to the infected eye, do not touch the edge of your eyelid with the eyedrop bottle or ointment tube. SEEK IMMEDIATE MEDICAL CARE IF:   Your infection has not improved within 3 days after beginning treatment.  You had yellow discharge from your eye and it returns.  You have increased eye pain.  Your eye redness is spreading.  Your vision becomes blurred.  You have a fever or persistent symptoms for more than 2-3 days.  You have a fever and your symptoms suddenly get worse.  You have facial pain, redness, or swelling. MAKE SURE YOU:   Understand these instructions.  Will watch your condition.  Will get help right away if you are not doing well or get worse.   This information is not intended to replace advice given to you by your health care provider. Make sure you discuss any questions you have with your health care provider.   Document Released: 05/30/2005 Document Revised: 06/20/2014 Document Reviewed: 10/31/2011 Elsevier Interactive Patient Education [employer]

## 2015-11-30 NOTE — Progress Notes (Addendum)
By signing my name below, I, Mesha Guinyard, attest that this documentation has been prepared under the direction and in the presence of Norberto SorensonEva Kimbrely Buckel, MD.  Electronically Signed: Arvilla MarketMesha Guinyard, Medical Scribe. 11/30/2015. 4:16 PM.  Subjective:    Patient ID: Tina Harper, female    DOB: 08/30/1955, 60 y.o.   MRN: [id number]  HPI Chief Complaint  Patient presents with  . Eye Problem    redness, swelling and mucus since 11/20/15    HPI Comments: Tina Harper is a 60 y.o. female who presents to the [facility] complaining of irritation in both eyes onset June When she woke up the next day after onset she had some yellow/pussy crust around her eye. Pt reports edema around her eye, eye discharge, eye itchiness, intermittent pain in her eye, and HA. Pt got her eyes checked out before for similar symptoms this office visit and given Olopatadine HCL for relief. Pt hasn't found any relief to her symptoms with the medication. Pt was told it couldn't be allergies if her symptoms didn't improrve. Pt has been using a cold compress to relieve her of the edema around her eye. Pt denies flashers, or floaters.  Pt works in [employer]. Pt stopped wearing her contacts 6/11 and threw them out. She has also stopped wearing all eye make-up and knows to discard and get new products. Will not resume eye makeup or contact use until sxs are completley resolved and top eye gtt medicaitons have been stopped.  There are no active problems to display for this patient.  Past Medical History  Diagnosis Date  . Diabetes mellitus   . Hypertension   . Elevated cholesterol    No past surgical history on file. No Known Allergies Prior to Admission medications   Medication Sig Start Date End Date Taking? Authorizing Provider  atorvastatin (LIPITOR) 10 MG tablet Take 10 mg by mouth daily.   Yes Historical Provider, MD  cholecalciferol (VITAMIN D) 1000 UNITS tablet Take 1,000 Units by mouth  daily.   Yes Historical Provider, MD  losartan-hydrochlorothiazide (HYZAAR) 100-12.5 MG per tablet Take 1 tablet by mouth daily.   Yes Historical Provider, MD  metFORMIN (GLUCOPHAGE) 500 MG tablet Take 500 mg by mouth 2 (two) times daily with a meal.   Yes Historical Provider, MD  olopatadine (PATANOL) 0.1 % ophthalmic solution 1 drop 2 (two) times daily.   Yes Historical Provider, MD  aspirin 81 MG tablet Take 81 mg by mouth daily. Reported on 11/30/2015    Historical Provider, MD   Social History   Social History  . Marital Status: Married    Spouse Name: N/A  . Number of Children: N/A  . Years of Education: N/A   Occupational History  . Not on file.   Social History Main Topics  . Smoking status: Never Smoker   . Smokeless tobacco: Never Used  . Alcohol Use: No  . Drug Use: No  . Sexual Activity: Not on file   Other Topics Concern  . Not on file   Social History Narrative   [facility] 11/30/2015  Decreased Interest 0  Down, Depressed, Hopeless 0  PHQ - 2 Score 0   Review of Systems  Constitutional: Negative for fever and fatigue.  HENT: Negative for rhinorrhea and sore throat.   Eyes: Positive for photophobia, pain, discharge and itching. Negative for visual disturbance.  Respiratory: Negative for cough.   Gastrointestinal: Negative for abdominal pain.  Neurological: Positive for headaches.  Objective:  BP 114/72 mmHg  Pulse 75  Temp(Src) 98.4 F (36.9 C) (Oral)  Resp 16  Ht 5' 5.5" (1.664 m)  Wt 202 lb 3.2 oz (91.717 kg)  BMI 33.12 kg/m2  SpO2 98%  Physical Exam  Constitutional: She is oriented to person, place, and time. She appears well-developed and well-nourished. No distress.  HENT:  Head: Normocephalic and atraumatic.  Right Ear: External ear normal.  Eyes: Conjunctivae are normal. No scleral icterus.  Mildly injected with mild eccymosis on righty Watery discharge Moderate amount bilaterally With more crusting seen at the left  especially lateral aspect  Funduscopic exam nl  Pulmonary/Chest: Effort normal.  Neurological: She is alert and oriented to person, place, and time.  Skin: Skin is warm and dry. She is not diaphoretic. No erythema.  Psychiatric: She has a normal mood and affect. Her behavior is normal.    Visual Acuity Screening   Right eye Left eye Both eyes  Without correction:     With correction:    Assessment & Plan:   1. Bilateral conjunctivitis   Suspect this is allergic with possible secondary bacterial infection but mild. Has failed olopatadine gtts for over 10d so try below.  If sxs cont, rec referral to optho. Try warm compresses to eyes prior to drop application and wash lids/lashes with no tears baby shampoo qd-bid to remove crusting drainage.  Meds ordered this encounter  Medications  . DISCONTD: olopatadine (PATANOL) 0.1 % ophthalmic solution    Sig: 1 drop 2 (two) times daily.  Marland Kitchen ofloxacin (OCUFLOX) 0.3 % ophthalmic solution    Sig: Place 1 drop into both eyes every 2 (two) hours. while awake for 2 days and then four times a day for 5 days    Dispense:  10 mL    Refill:  0  . azelastine (OPTIVAR) 0.05 % ophthalmic solution    Sig: Place 1 drop into both eyes 2 (two) times daily.    Dispense:  6 mL    Refill:  1    I personally performed the services described in this documentation, which was scribed in my presence. The recorded information has been reviewed and considered, and addended by me as needed.   Norberto Sorenson, M.D.  [facility] [address] [place], [place] [address] [phone] phone [phone] fax  11/30/2015 7:07 PM

## 2016-09-06 ENCOUNTER — Ambulatory Visit (INDEPENDENT_AMBULATORY_CARE_PROVIDER_SITE_OTHER): Payer: 59 | Admitting: Student

## 2016-09-06 DIAGNOSIS — J069 Acute upper respiratory infection, unspecified: Secondary | ICD-10-CM | POA: Diagnosis not present

## 2016-09-06 MED ORDER — GUAIFENESIN-CODEINE 100-10 MG/5ML PO SOLN: 5.0000 mL | Freq: Every evening | ORAL | 0 refills | Status: AC | PRN

## 2016-09-06 NOTE — Assessment & Plan Note (Signed)
Symptomatic care, mucinex, robitussin, nasal saline sprays, cough drops, follow up if not improving or worsening.  Did not appear that needs antibiotics at this time.  Did not need work note.

## 2016-09-06 NOTE — Patient Instructions (Signed)
     IF you received an x-ray today, you will receive an invoice from [facility]. Please contact [facility] at [phone] with questions or concerns regarding your invoice.   IF you received labwork today, you will receive an invoice from [facility]. Please contact [facility] at [phone] with questions or concerns regarding your invoice.   Our billing staff will not be able to assist you with questions regarding bills from these companies.  You will be contacted with the lab results as soon as they are available. The fastest way to get your results is to activate your My Chart account. Instructions are located on the last page of this paperwork. If you have not heard from us regarding the results in 2 weeks, please contact this office.     

## 2016-09-06 NOTE — Progress Notes (Signed)
   Subjective:    Patient ID: Tina Harper, female    DOB: 07/09/1955, 61 y.o.   MRN: [id number]  HPI Cough, congestion, earache, chills that have been present for 4 days.  She has no sick contacts.  Denies sore throat, SOB, CP.  She is having trouble sleeping due to cough.  PMHx - Updated and reviewed.  Contributory factors include: HTN, DM PSHx - Updated and reviewed.  Contributory factors include:  Negative FHx - Updated and reviewed.  Contributory factors include:  Negative Social Hx - Updated and reviewed. Contributory factors include: Negative Medications - reviewed    Review of Systems  Constitutional: Positive for chills. Negative for fatigue and fever.  HENT: Positive for congestion, ear pain, postnasal drip, rhinorrhea and sinus pressure. Negative for ear discharge, sore throat, trouble swallowing and voice change.   Eyes: Negative for discharge and itching.  Respiratory: Positive for cough. Negative for chest tightness, shortness of breath and wheezing.   Cardiovascular: Negative for chest pain, palpitations and leg swelling.  Gastrointestinal: Negative for abdominal pain and nausea.  Genitourinary: Negative for dysuria and urgency.  Musculoskeletal: Negative for arthralgias and joint swelling.  Skin: Negative for rash and wound.  Psychiatric/Behavioral: Negative for agitation and confusion.  All other systems reviewed and are negative.      Objective:   Physical Exam  Constitutional: She is oriented to person, place, and time. She appears well-developed and well-nourished. No distress.  HENT:  Head: Normocephalic and atraumatic.  Right Ear: External ear normal.  Left Ear: External ear normal.  Mouth/Throat: No oropharyngeal exudate.  Bilateral nasal turbinates with erythema and edema, no discharge present  Neck: Normal range of motion. Neck supple.  Cardiovascular: Normal rate, regular rhythm, normal heart sounds and intact distal pulses.  Exam reveals no gallop and  no friction rub.   No murmur heard. Pulmonary/Chest: Effort normal and breath sounds normal. No respiratory distress. She has no wheezes. She has no rales. She exhibits no tenderness.  Musculoskeletal: Normal range of motion. She exhibits no edema.  Lymphadenopathy:    She has no cervical adenopathy.  Neurological: She is alert and oriented to person, place, and time.  Skin: Skin is warm. No rash noted. She is not diaphoretic. No erythema.  Psychiatric: She has a normal mood and affect. Her behavior is normal. Judgment and thought content normal.  Nursing note and vitals reviewed.  BP 128/82 (BP Location: Left Arm, Patient Position: Sitting, Cuff Size: Small)   Pulse 87   Temp 98.6 F (37 C) (Oral)   Resp 18   Ht 5' 5.5" (1.664 m)   Wt 202 lb (91.6 kg)   SpO2 96%   BMI 33.10 kg/m         Assessment & Plan:  Acute upper respiratory infection Symptomatic care, mucinex, robitussin, nasal saline sprays, cough drops, follow up if not improving or worsening.  Did not appear that needs antibiotics at this time.  Did not need work note.    Signed,  Corliss MarcusAlicia Barnes, DO [facility] 5:53 PM

## 2020-06-11 ENCOUNTER — Emergency Department (HOSPITAL_COMMUNITY)
Admission: EM | Admit: 2020-06-11 | Discharge: 2020-06-11 | Disposition: A | Discharge status: Home / Self Care | Payer: 59 | Attending: Emergency Medicine | Admitting: Emergency Medicine

## 2020-06-11 ENCOUNTER — Encounter (HOSPITAL_COMMUNITY): Payer: Self-pay

## 2020-06-11 ENCOUNTER — Emergency Department (HOSPITAL_COMMUNITY): Payer: 59

## 2020-06-11 ENCOUNTER — Other Ambulatory Visit: Payer: Self-pay

## 2020-06-11 DIAGNOSIS — Z7982 Long term (current) use of aspirin: Secondary | ICD-10-CM | POA: Insufficient documentation

## 2020-06-11 DIAGNOSIS — R110 Nausea: Secondary | ICD-10-CM | POA: Diagnosis not present

## 2020-06-11 DIAGNOSIS — Z7984 Long term (current) use of oral hypoglycemic drugs: Secondary | ICD-10-CM | POA: Insufficient documentation

## 2020-06-11 DIAGNOSIS — E119 Type 2 diabetes mellitus without complications: Secondary | ICD-10-CM | POA: Diagnosis not present

## 2020-06-11 DIAGNOSIS — I10 Essential (primary) hypertension: Secondary | ICD-10-CM | POA: Diagnosis not present

## 2020-06-11 DIAGNOSIS — R072 Precordial pain: Secondary | ICD-10-CM | POA: Diagnosis not present

## 2020-06-11 DIAGNOSIS — M549 Dorsalgia, unspecified: Secondary | ICD-10-CM | POA: Insufficient documentation

## 2020-06-11 LAB — CBC
HCT: 37.2 % (ref 36.0–46.0)
Hemoglobin: 12.2 g/dL (ref 12.0–15.0)
MCH: 29.8 pg (ref 26.0–34.0)
MCHC: 32.8 g/dL (ref 30.0–36.0)
MCV: 91 fL (ref 80.0–100.0)
Platelets: 383 10*3/uL (ref 150–400)
RBC: 4.09 MIL/uL (ref 3.87–5.11)
RDW: 13.4 % (ref 11.5–15.5)
WBC: 13 10*3/uL — ABNORMAL HIGH (ref 4.0–10.5)
nRBC: 0 % (ref 0.0–0.2)

## 2020-06-11 LAB — BASIC METABOLIC PANEL
Anion gap: 13 (ref 5–15)
BUN: 15 mg/dL (ref 8–23)
CO2: 25 mmol/L (ref 22–32)
Calcium: 9.5 mg/dL (ref 8.9–10.3)
Chloride: 100 mmol/L (ref 98–111)
Creatinine, Ser: 0.98 mg/dL (ref 0.44–1.00)
GFR, Estimated: >60 mL/min (ref >60)
Glucose, Bld: 136 mg/dL — ABNORMAL HIGH (ref 70–99)
Potassium: 3.6 mmol/L (ref 3.5–5.1)
Sodium: 138 mmol/L (ref 135–145)

## 2020-06-11 LAB — TROPONIN I (HIGH SENSITIVITY)
Troponin I (High Sensitivity): <2 ng/L (ref <18)
Troponin I (High Sensitivity): <2 ng/L (ref <18)

## 2020-06-11 LAB — D-DIMER, QUANTITATIVE: D-Dimer, Quant: 0.31 ug/mL-FEU (ref 0.00–0.50)

## 2020-06-11 MED ORDER — ONDANSETRON 4 MG PO TBDP: 4.0000 mg | ORAL_TABLET | Freq: Three times a day (TID) | ORAL | 0 refills | Status: AC | PRN

## 2020-06-11 MED ORDER — METHOCARBAMOL 500 MG PO TABS: 500.0000 mg | ORAL_TABLET | Freq: Three times a day (TID) | ORAL | 0 refills | Status: AC | PRN

## 2020-06-11 MED ORDER — KETOROLAC TROMETHAMINE 60 MG/2ML IM SOLN
30.0000 mg | Freq: Once | INTRAMUSCULAR | Status: AC
  Administered 2020-06-11: 30 mg via INTRAMUSCULAR
  Filled 2020-06-11: qty 2

## 2020-06-11 NOTE — ED Provider Notes (Signed)
Care of the patient assumed at the change of shift with [facility] back pain for a few days, now some chest pain pain. Awaiting Dimer and second trop.  Physical Exam  BP 130/76   Pulse 70   Temp 98 F (36.7 C) (Oral)   Resp 12   SpO2 97%   Physical Exam  ED Course/Procedures     Procedures  MDM  Dimer is negative, second trop remains normal. Patient reports some mild nausea, but pain is improved with rest. Only hurts when she's moving around. Offered Toradol for comfort here. She would like some nausea meds to take at home if needed. PCP follow up. RTED for any other concerns.        Pollyann Savoy, MD 06/11/20 [phone]

## 2020-06-11 NOTE — ED Triage Notes (Signed)
Pt reports a pulling sensation in upper back for a few days but was awaken from her sleep with a heaviness/ chest pressure this morning and was extremely diaphoretic.

## 2020-06-11 NOTE — ED Provider Notes (Signed)
[facility]-EMERGENCY DEPT Provider Note  CSN: [id number] Arrival date & time: 06/11/20 0300  Chief Complaint(s) Chest Pain  HPI Tina Harper is a 64 y.o. female with a past medical history listed below who presents to the emergency department with sudden onset substernal chest pain that awoke her from sleep earlier this evening.  Pain lasted less than 1 minute.  Described as substernal pressure that was nonradiating and nonexertional.  No associated shortness of breath.  Patient reported nausea without emesis. She denies any recent fevers or infections.  No coughing or congestion. No abdominal pain. Patient reports negative stress test 2 years ago. No prior catheterization required. No known family history of heart attack before the age of 62.  Patient reports having back pain for 1 week that is worse with movement and palpation of the right parascapular musculature.  She reports that at times this pain radiates around into her chest but that is more of a sharp pain.  Patient did reports recently traveling to [place]. Also reports noticing that her left leg is a little bit bigger than the right. No prior history of DVTs or PEs.  HPI  Past Medical History Past Medical History:  Diagnosis Date  . Diabetes mellitus   . Elevated cholesterol   . Hypertension    Patient Active Problem List   Diagnosis Date Noted  . Acute upper respiratory infection 09/06/2016  . Vitamin D deficiency 03/24/2015  . Leukocytosis 03/18/2013  . Diabetes mellitus type 2, uncontrolled (HCC) 08/21/2012  . HTN (hypertension) 05/28/2012  . Hyperlipidemia 05/28/2012   Home Medication(s) Prior to Admission medications   Medication Sig Start Date End Date Taking? Authorizing Provider  aspirin 81 MG tablet Take 81 mg by mouth daily. Reported on 11/30/2015    [provider]  atorvastatin (LIPITOR) 10 MG tablet Take 10 mg by mouth daily.    [provider]  azelastine  (OPTIVAR) 0.05 % ophthalmic solution Place 1 drop into both eyes 2 (two) times daily. Patient not taking: Reported on 09/06/2016 11/30/15   Sherren MochaShaw, Eva N, MD  cholecalciferol (VITAMIN D) 1000 UNITS tablet Take 1,000 Units by mouth daily.    [provider]  guaiFENesin-codeine 100-10 MG/5ML syrup Take 5 mLs by mouth at bedtime as needed for cough. 09/06/16   Chitanand, Ilona SorrelAlicia B, DO  losartan-hydrochlorothiazide (HYZAAR) 100-12.5 MG per tablet Take 1 tablet by mouth daily.    [provider]  metFORMIN (GLUCOPHAGE) 500 MG tablet Take 500 mg by mouth 2 (two) times daily with a meal.    [provider]  ofloxacin (OCUFLOX) 0.3 % ophthalmic solution Place 1 drop into both eyes every 2 (two) hours. while awake for 2 days and then four times a day for 5 days Patient not taking: Reported on 09/06/2016 11/30/15   Sherren MochaShaw, Eva N, MD  Past Surgical History History reviewed. No pertinent surgical history. Family History Family History  Problem Relation Age of Onset  . Heart disease Father        heart transplant  . Aneurysm Mother   . Other Maternal Grandmother     Social History Social History   Tobacco Use  . Smoking status: Never Smoker  . Smokeless tobacco: Never Used  Substance Use Topics  . Alcohol use: No  . Drug use: No   Allergies Patient has no known allergies.  Review of Systems Review of Systems All other systems are reviewed and are negative for acute change except as noted in the HPI  Physical Exam Vital Signs  I have reviewed the triage vital signs BP (!) 162/84 (BP Location: Left Arm)   Pulse 88   Temp 98 F (36.7 C) (Oral)   Resp 16   SpO2 99%   Physical Exam Vitals reviewed.  Constitutional:      General: She is not in acute distress.    Appearance: She is well-developed and well-nourished. She is not diaphoretic.   HENT:     Head: Normocephalic and atraumatic.     Nose: Nose normal.  Eyes:     General: No scleral icterus.       Right eye: No discharge.        Left eye: No discharge.     Extraocular Movements: EOM normal.     Conjunctiva/sclera: Conjunctivae normal.     Pupils: Pupils are equal, round, and reactive to light.  Cardiovascular:     Rate and Rhythm: Normal rate and regular rhythm.     Heart sounds: No murmur heard. No friction rub. No gallop.   Pulmonary:     Effort: Pulmonary effort is normal. No respiratory distress.     Breath sounds: Normal breath sounds. No stridor. No rales.  Chest:     Chest wall: No tenderness.  Abdominal:     General: There is no distension.     Palpations: Abdomen is soft.     Tenderness: There is no abdominal tenderness.  Musculoskeletal:        General: No edema.     Cervical back: Normal range of motion and neck supple.     Thoracic back: Spasms and tenderness present. No bony tenderness.       Back:  Skin:    General: Skin is warm and dry.     Findings: No erythema or rash.  Neurological:     Mental Status: She is alert and oriented to person, place, and time.  Psychiatric:        Mood and Affect: Mood and affect normal.     ED Results and Treatments Labs (all labs ordered are listed, but only abnormal results are displayed) Labs Reviewed  BASIC METABOLIC PANEL - Abnormal; Notable for the following components:      Result Value   Glucose, Bld 136 (*)    All other components within normal limits  CBC - Abnormal; Notable for the following components:   WBC 13.0 (*)    All other components within normal limits  D-DIMER, QUANTITATIVE (NOT AT [facility])  TROPONIN I (HIGH SENSITIVITY)  TROPONIN I (HIGH SENSITIVITY)  EKG  EKG Interpretation  Date/Time:  Thursday June 11 2020 03:07:36 EST Ventricular Rate:  85 PR  Interval:    QRS Duration: 104 QT Interval:  420 QTC Calculation: 500 R Axis:   36 Text Interpretation: Sinus rhythm Low voltage, precordial leads Borderline T abnormalities, anterior leads Borderline prolonged QT interval 12 Lead; Mason-Likar NO STEMI. No old tracing to compare Confirmed by Drema Pry [phone]) on 06/11/2020 6:16:20 AM      Radiology DG Chest 2 View  Result Date: 06/11/2020 CLINICAL DATA:  Chest pain EXAM: CHEST - 2 VIEW COMPARISON:  None. FINDINGS: The heart size and mediastinal contours are within normal limits. Both lungs are clear. The visualized skeletal structures are unremarkable. IMPRESSION: No active cardiopulmonary disease. Electronically Signed   By: Jonna Clark M.D.   On: 06/11/2020 03:47    Pertinent labs & imaging results that were available during my care of the patient were reviewed by me and considered in my medical decision making (see chart for details).  Medications Ordered in ED Medications - No data to display                                                                                                                                  Procedures Procedures  (including critical care time)  Medical Decision Making / ED Course I have reviewed the nursing notes for this encounter and the patient's prior records (if available in EHR or on provided paperwork).   Tina Harper was evaluated in Emergency Department on 06/11/2020 for the symptoms described in the history of present illness. She was evaluated in the context of the global COVID-19 pandemic, which necessitated consideration that the patient might be at risk for infection with the SARS-CoV-2 virus that causes COVID-19. Institutional protocols and algorithms that pertain to the evaluation of patients at risk for COVID-19 are in a state of rapid change based on information released by regulatory bodies including the CDC and federal and state organizations. These policies and algorithms were  followed during the patient's care in the ED.   Patient presents with atypical chest pain. EKG with nonspecific T wave changes. No prior for comparison. Initial troponin negative. Low suspicion for ACS. Will get delta trop.  Intermediate pretest probability for PE.  Will obtain a dimer.  Back pain is most consistent with muscle strain/spasm. Presentation is not classic for aortic dissection or esophageal perforation. Chest x-ray without evidence suggestive of pneumonia, pneumothorax, pneumomediastinum.  No abnormal contour of the mediastinum to suggest dissection. No evidence of acute injuries.   Patient care turned over to Dr Bernette Mayers. Patient case and results discussed in detail; please see their note for further ED managment.        Final Clinical Impression(s) / ED Diagnoses Final diagnoses:  Precordial pain      This chart was dictated using voice recognition software.  Despite best efforts to proofread,  errors can occur which  can change the documentation meaning.   Nira Conn, MD 06/11/20 [phone]
